# Patient Record
Sex: Male | Born: 1985 | Race: Black or African American | Hispanic: No | Marital: Single | Smoking: Current some day smoker
Health system: Southern US, Community
[De-identification: ages and names within clinical notes are randomized; demographics above are authoritative.]

---

## 2008-08-06 ENCOUNTER — Emergency Department (HOSPITAL_COMMUNITY): Admission: EM | Admit: 2008-08-06 | Discharge: 2008-08-06 | Payer: Self-pay | Admitting: Emergency Medicine

## 2009-11-28 IMAGING — CT CT NECK W/ CM
3 series · 16 of 33 positions shown, 19 images · IV contrast (agent unspecified)
Comparison: None

CLINICAL DATA: Sore throat, difficulty swallowing

CT NECK WITH CONTRAST
TECHNIQUE: Multidetector CT imaging of the neck was performed with
intravenous contrast.   Sagittal and coronal MPR images
reconstructed from axial data set.
Contrast: 100 ml Dmnipaque-XCC

[Series 2: neck rtn st · axial · 0.39mm/px · z∈[-222,-56]mm · 8 of 67 slices shown, 10 images]
[im 6/67  soft-tissue]
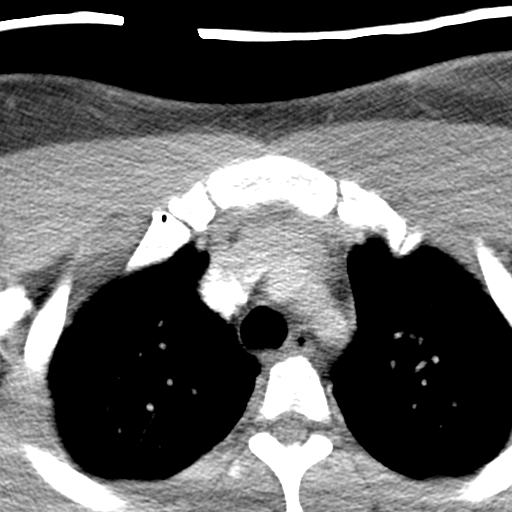
[im 6/67  bone]
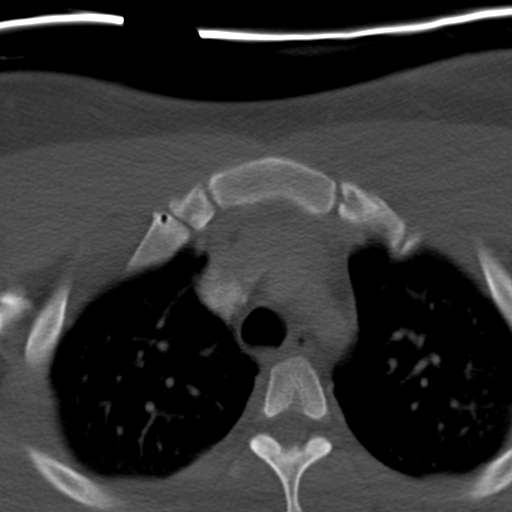
[im 16/67  bone]
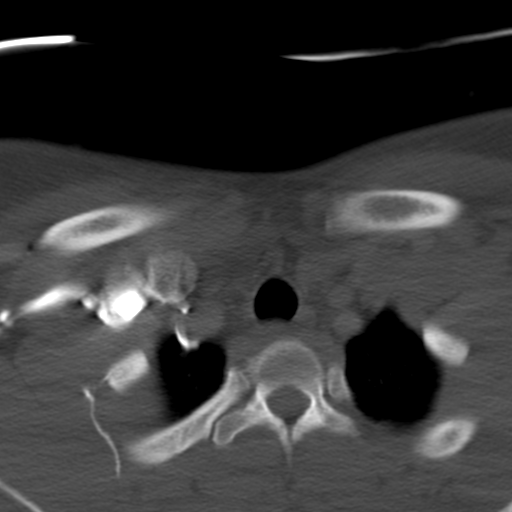
[im 21/67  bone]
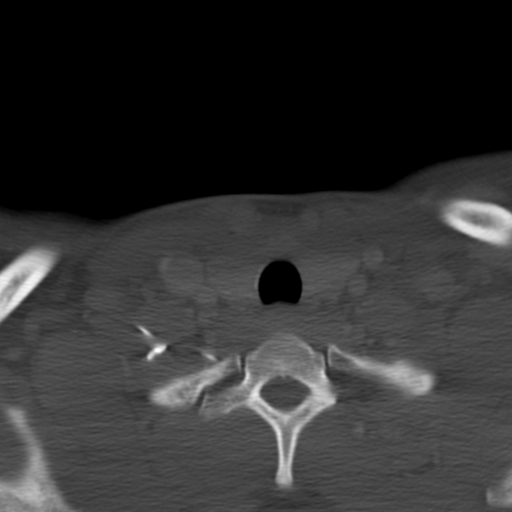
[im 31/67  bone]
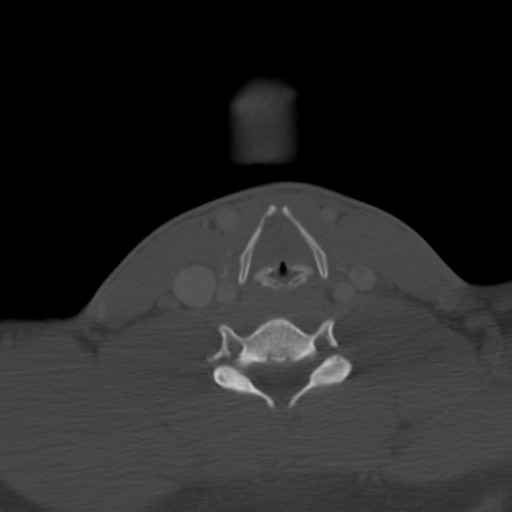
[im 36/67  soft-tissue]
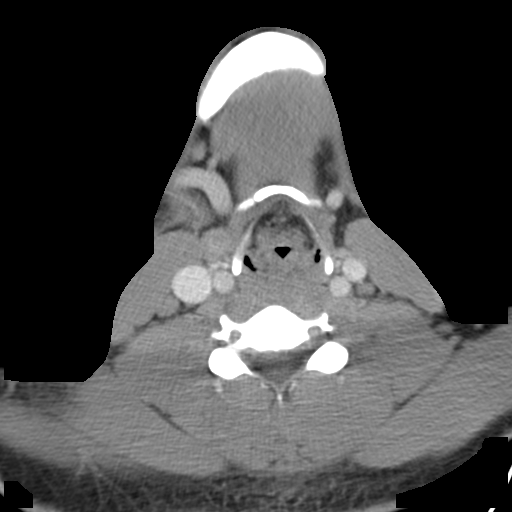
[im 36/67  bone]
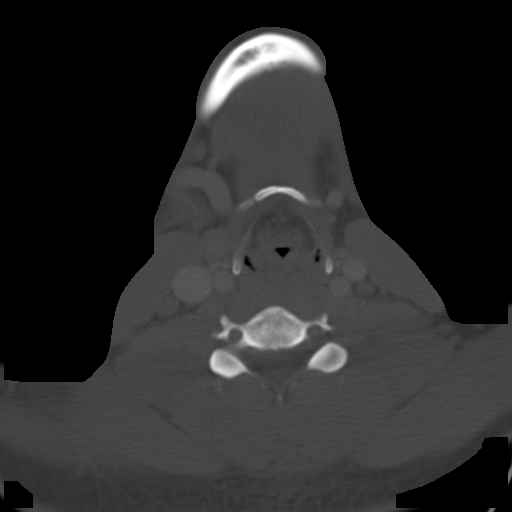
[im 46/67  bone]
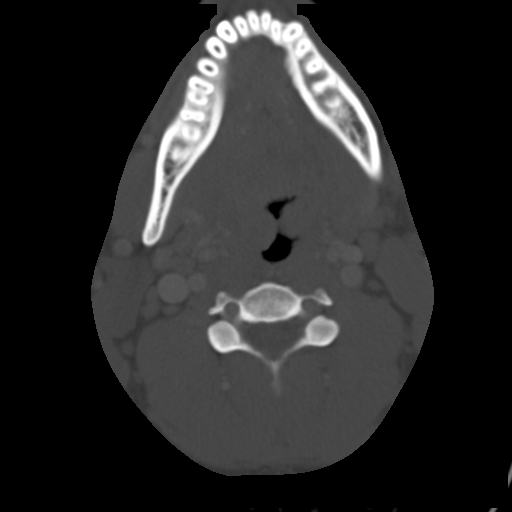
[im 51/67  bone]
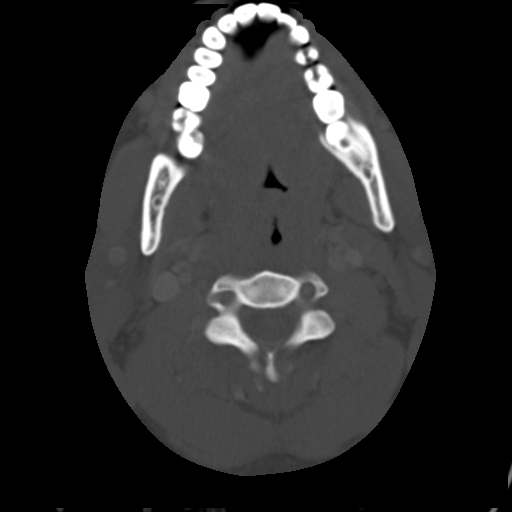
[im 61/67  bone]
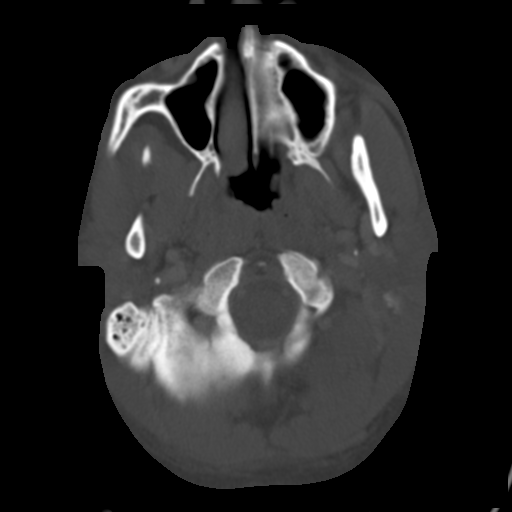

[Series 602: coronal images · coronal · 0.39mm/px · 3 of 99 slices shown]
[im 20/99  bone]
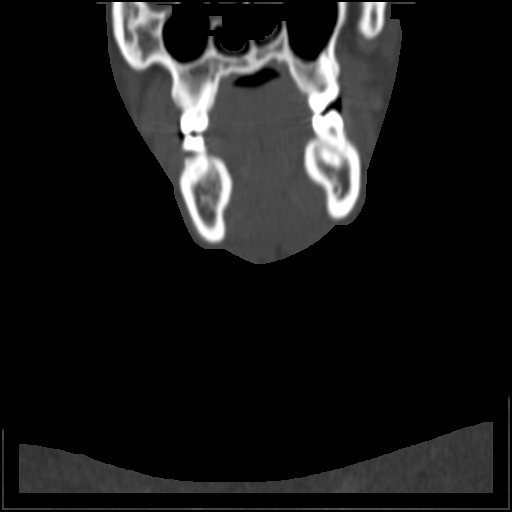
[im 40/99  bone]
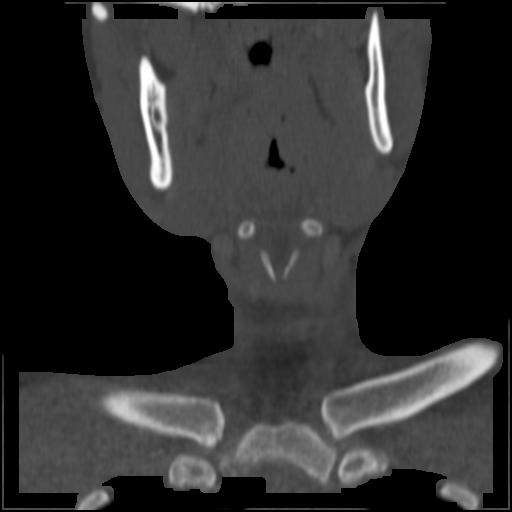
[im 59/99  bone]
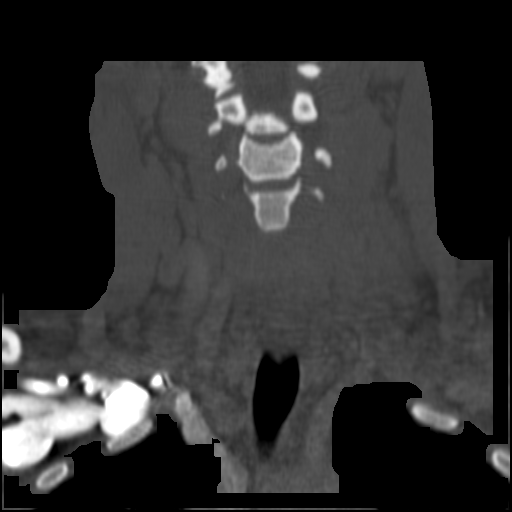

[Series 603: sagittal images · sagittal · 0.39mm/px · 5 of 98 slices shown, 6 images]
[im 33/98  bone]
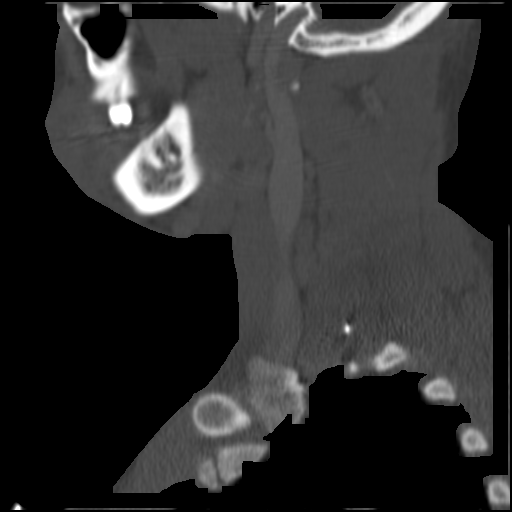
[im 41/98  bone]
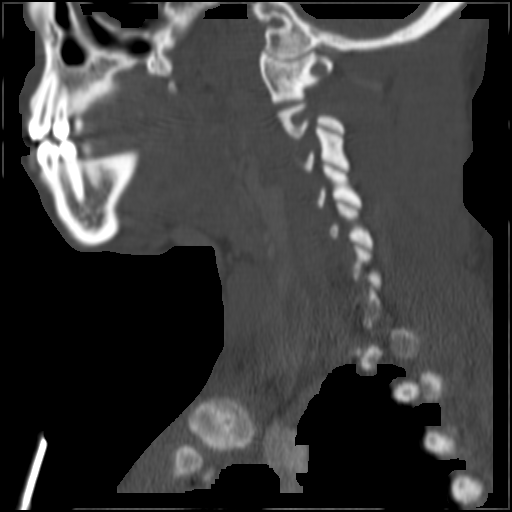
[im 49/98  soft-tissue]
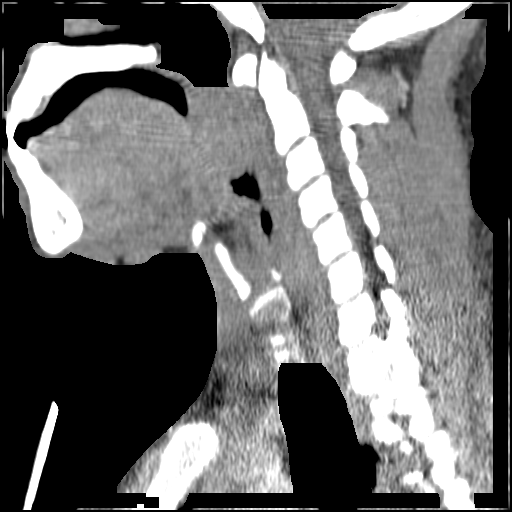
[im 49/98  bone]
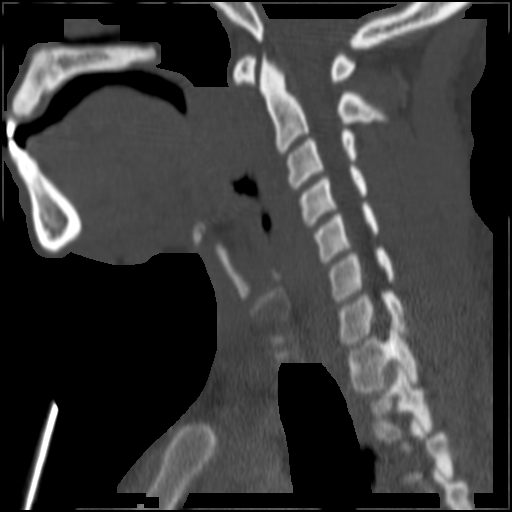
[im 57/98  bone]
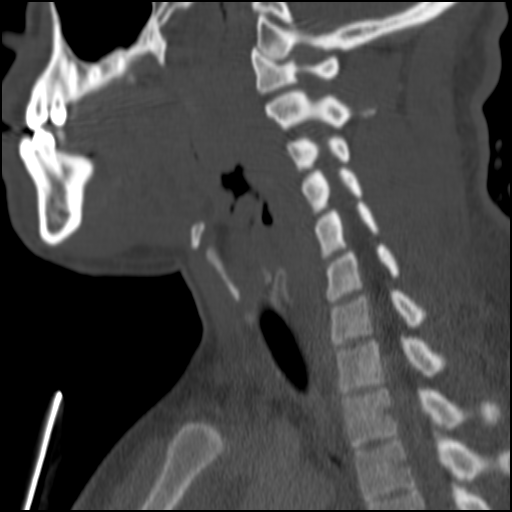
[im 65/98  bone]
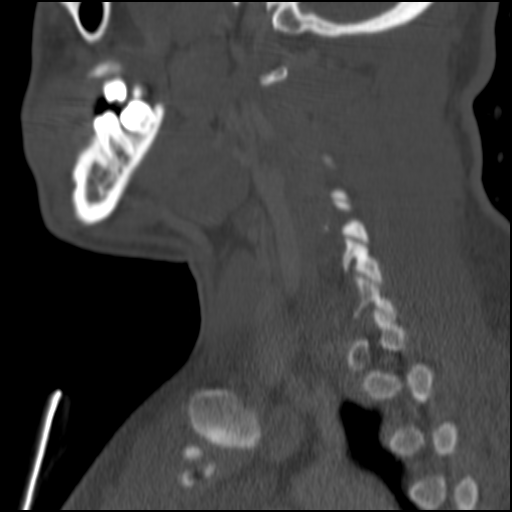

[16 of 33 positions shown; findings below may reference images not displayed]

FINDINGS: Mild enlargement of parapharyngeal lymphoid tissue and tonsils.
In addition, focal area of low attenuation is seen in the right
parapharyngeal soft tissues, 14 x 10 mm in size, compatible with
edema.
This may represent phlegmon or early abscess formation but does not
appear well defined or encapsulated at this point.
Narrowing of oropharynx.
Parapharyngeal soft tissue planes are diffusely infiltrated on the
right.
Prevertebral soft tissues normal in thickness.
Vascular structures patent.
Symmetric parotid, submandibular and thyroid glands.
No abnormal soft tissue gas identified.
Visualized skull base intact.
Lung apices clear.
IMPRESSION: Tonsillar and parapharyngeal lymphoid tissue enlargement diffusely,
with narrowing of the oropharynx and hypopharynx.
Scattered edema in the right parapharyngeal soft tissues with a
more focal area of low attenuation which may represent phlegmon or
developing abscess, 14 x 10 mm in size, though this does not appear
well defined or mature at this point.

## 2010-06-05 LAB — BASIC METABOLIC PANEL
BUN: 10 mg/dL (ref 6–23)
CO2: 24 mEq/L (ref 19–32)
Chloride: 104 mEq/L (ref 96–112)
Potassium: 3.6 mEq/L (ref 3.5–5.1)

## 2010-06-05 LAB — CBC
HCT: 46 % (ref 39.0–52.0)
Hemoglobin: 15.4 g/dL (ref 13.0–17.0)
Platelets: 222 10*3/uL (ref 150–400)
RBC: 5.4 MIL/uL (ref 4.22–5.81)
WBC: 9.4 10*3/uL (ref 4.0–10.5)

## 2010-06-05 LAB — DIFFERENTIAL
Eosinophils Relative: 1 % (ref 0–5)
Lymphocytes Relative: 20 % (ref 12–46)
Lymphs Abs: 1.9 10*3/uL (ref 0.7–4.0)
Monocytes Relative: 9 % (ref 3–12)

## 2010-06-05 LAB — RAPID STREP SCREEN (MED CTR MEBANE ONLY): Streptococcus, Group A Screen (Direct): NEGATIVE

## 2010-06-05 LAB — MONONUCLEOSIS SCREEN: Mono Screen: POSITIVE — AB

## 2014-10-10 ENCOUNTER — Emergency Department (HOSPITAL_COMMUNITY): Payer: Self-pay

## 2014-10-10 ENCOUNTER — Encounter (HOSPITAL_COMMUNITY): Payer: Self-pay

## 2014-10-10 DIAGNOSIS — S20211A Contusion of right front wall of thorax, initial encounter: Secondary | ICD-10-CM | POA: Insufficient documentation

## 2014-10-10 DIAGNOSIS — Y9361 Activity, american tackle football: Secondary | ICD-10-CM | POA: Insufficient documentation

## 2014-10-10 DIAGNOSIS — Y998 Other external cause status: Secondary | ICD-10-CM | POA: Insufficient documentation

## 2014-10-10 DIAGNOSIS — Y9289 Other specified places as the place of occurrence of the external cause: Secondary | ICD-10-CM | POA: Insufficient documentation

## 2014-10-10 DIAGNOSIS — X58XXXA Exposure to other specified factors, initial encounter: Secondary | ICD-10-CM | POA: Insufficient documentation

## 2014-10-10 DIAGNOSIS — Z720 Tobacco use: Secondary | ICD-10-CM | POA: Insufficient documentation

## 2014-10-10 MED ORDER — OXYCODONE-ACETAMINOPHEN 5-325 MG PO TABS
ORAL_TABLET | ORAL | Status: AC
  Filled 2014-10-10: qty 1

## 2014-10-10 MED ORDER — OXYCODONE-ACETAMINOPHEN 5-325 MG PO TABS
1.0000 | ORAL_TABLET | Freq: Once | ORAL | Status: AC
  Administered 2014-10-10: 1 via ORAL

## 2014-10-10 NOTE — ED Notes (Signed)
Per PTAR, pt was playing football earlier today and landed on the football on right rib area. Went home and wasn't bothering him. Tonight woke up at 2100 and was hurting to breathe. Didn't feel a pop but is guarding the area.

## 2014-10-11 ENCOUNTER — Emergency Department (HOSPITAL_COMMUNITY)
Admission: EM | Admit: 2014-10-11 | Discharge: 2014-10-11 | Disposition: A | Discharge status: Home / Self Care | Payer: Self-pay | Attending: Emergency Medicine | Admitting: Emergency Medicine

## 2014-10-11 DIAGNOSIS — S20211A Contusion of right front wall of thorax, initial encounter: Secondary | ICD-10-CM

## 2014-10-11 MED ORDER — KETOROLAC TROMETHAMINE 60 MG/2ML IM SOLN
60.0000 mg | Freq: Once | INTRAMUSCULAR | Status: AC
  Administered 2014-10-11: 60 mg via INTRAMUSCULAR
  Filled 2014-10-11: qty 2

## 2014-10-11 MED ORDER — HYDROCODONE-ACETAMINOPHEN 5-325 MG PO TABS: 2.0000 | ORAL_TABLET | ORAL | Status: DC | PRN

## 2014-10-11 MED ORDER — DICLOFENAC SODIUM 75 MG PO TBEC: 75.0000 mg | DELAYED_RELEASE_TABLET | Freq: Two times a day (BID) | ORAL | Status: DC

## 2014-10-11 NOTE — ED Provider Notes (Signed)
CSN: [id number]     Arrival date & time 10/10/14  2318 History   First MD Initiated Contact with Patient 10/11/14 0020     Chief Complaint  Patient presents with  . rib pain      (Consider location/radiation/quality/duration/timing/severity/associated sxs/prior Treatment) Patient is a 29 y.o. male presenting with chest pain. The history is provided by the patient. No language interpreter was used.  Chest Pain Pain location:  R chest Pain quality: aching   Pain radiates to:  Does not radiate Pain radiates to the back: no   Pain severity:  Moderate Onset quality:  Sudden Duration:  6 hours Timing:  Constant Progression:  Worsening Chronicity:  New Context: breathing and movement   Relieved by:  Nothing Associated symptoms: no abdominal pain   Pt reports he fell onto the football while playing football.  Pt complains of pain in ribs  History reviewed. No pertinent past medical history. History reviewed. No pertinent past surgical history. No family history on file. Social History  Substance Use Topics  . Smoking status: Current Every Day Smoker  . Smokeless tobacco: None  . Alcohol Use: Yes     Comment: socially    Review of Systems  Cardiovascular: Positive for chest pain.  Gastrointestinal: Negative for abdominal pain.  All other systems reviewed and are negative.     Allergies  Review of patient's allergies indicates no known allergies.  Home Medications   Prior to Admission medications   Not on File   BP 109/69 mmHg  Pulse 72  Temp(Src) 98.4 F (36.9 C) (Oral)  Resp 20  SpO2 97% Physical Exam  Constitutional: He is oriented to person, place, and time. He appears well-developed and well-nourished.  HENT:  Head: Normocephalic.  Eyes: Conjunctivae and EOM are normal. Pupils are equal, round, and reactive to light.  Neck: Normal range of motion.  Cardiovascular: Normal rate.   Pulmonary/Chest: Effort normal.  Tender right lower anterior ribs.     Abdominal: He exhibits no distension.  Musculoskeletal: Normal range of motion.  Neurological: He is alert and oriented to person, place, and time.  Skin: Skin is warm.  Psychiatric: He has a normal mood and affect.  Nursing note and vitals reviewed.   ED Course  Procedures (including critical care time) Labs Review Labs Reviewed - No data to display  Imaging Review Dg Chest 2 View  10/11/2014   CLINICAL DATA:  Acute onset of right lower anterior chest pain and shortness of breath. Initial encounter.  EXAM: CHEST  2 VIEW  COMPARISON:  None.  FINDINGS: The lungs are well-aerated and clear. There is no evidence of focal opacification, pleural effusion or pneumothorax.  The heart is normal in size; the mediastinal contour is within normal limits. No acute osseous abnormalities are seen.  IMPRESSION: No acute cardiopulmonary process seen. No displaced rib fractures identified.   Electronically Signed   By: Roanna Raider M.D.   On: 10/11/2014 00:50   I, SOFIA,KAREN, personally reviewed and evaluated these images and lab results as part of my medical decision-making.   EKG Interpretation None      MDM  Pt reports no relief with percocet.  Pt given torodol Chest xray normal.   Final diagnoses:  Contusion of right chest wall, initial encounter    Hydrocodone voltaren Return if any problems.     Elson Areas, PA-C 10/11/14 0124  Cy Blamer, MD 10/11/14 [phone]

## 2014-10-11 NOTE — Discharge Instructions (Signed)

## 2014-10-11 NOTE — ED Notes (Signed)
Provider at bedside

## 2016-02-01 IMAGING — CR DG CHEST 2V
2 series · 2 of 2 positions shown · non-contrast
Comparison: None.

CLINICAL DATA: Acute onset of right lower anterior chest pain and
shortness of breath. Initial encounter.

EXAM:
CHEST  2 VIEW

[chest pa]
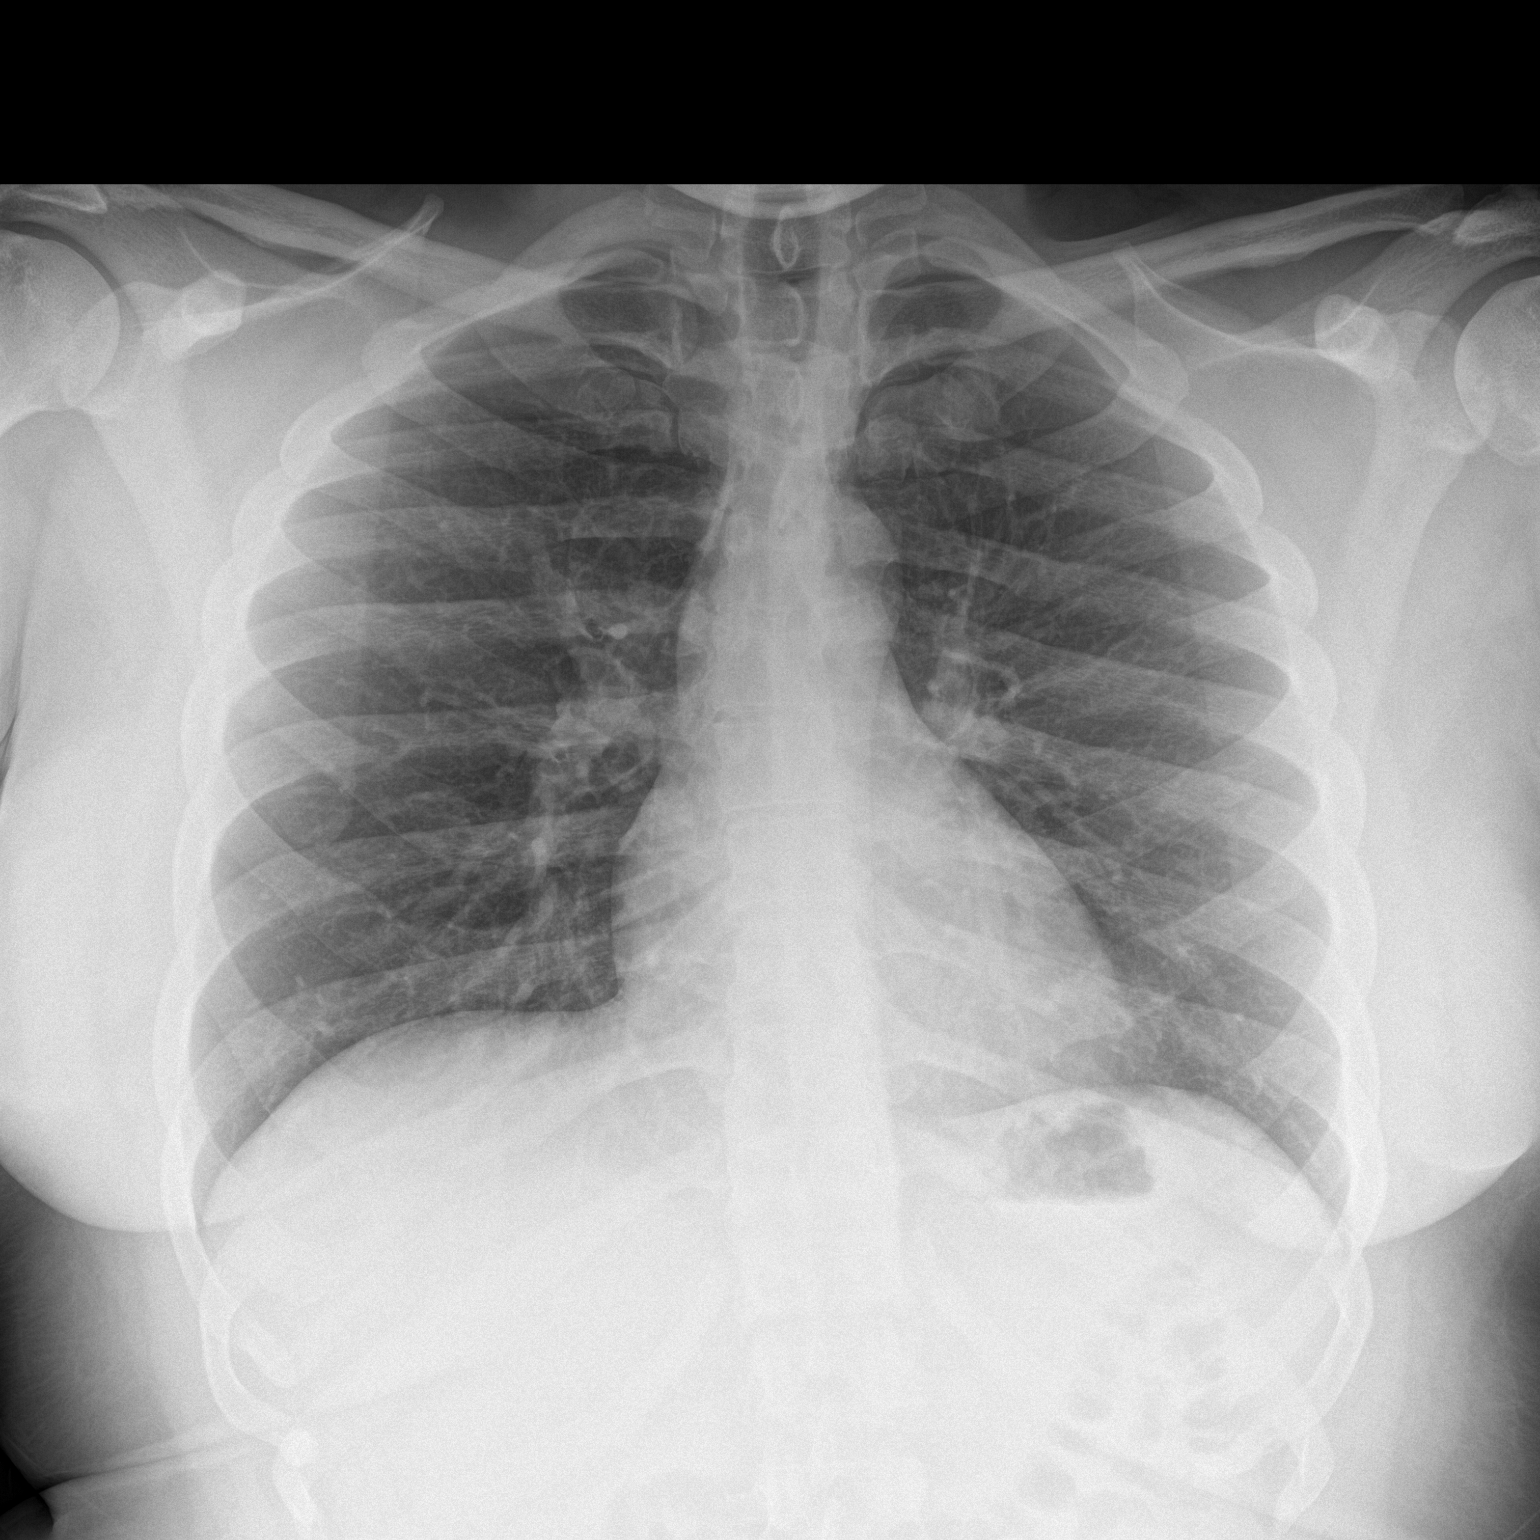

[chest lat]
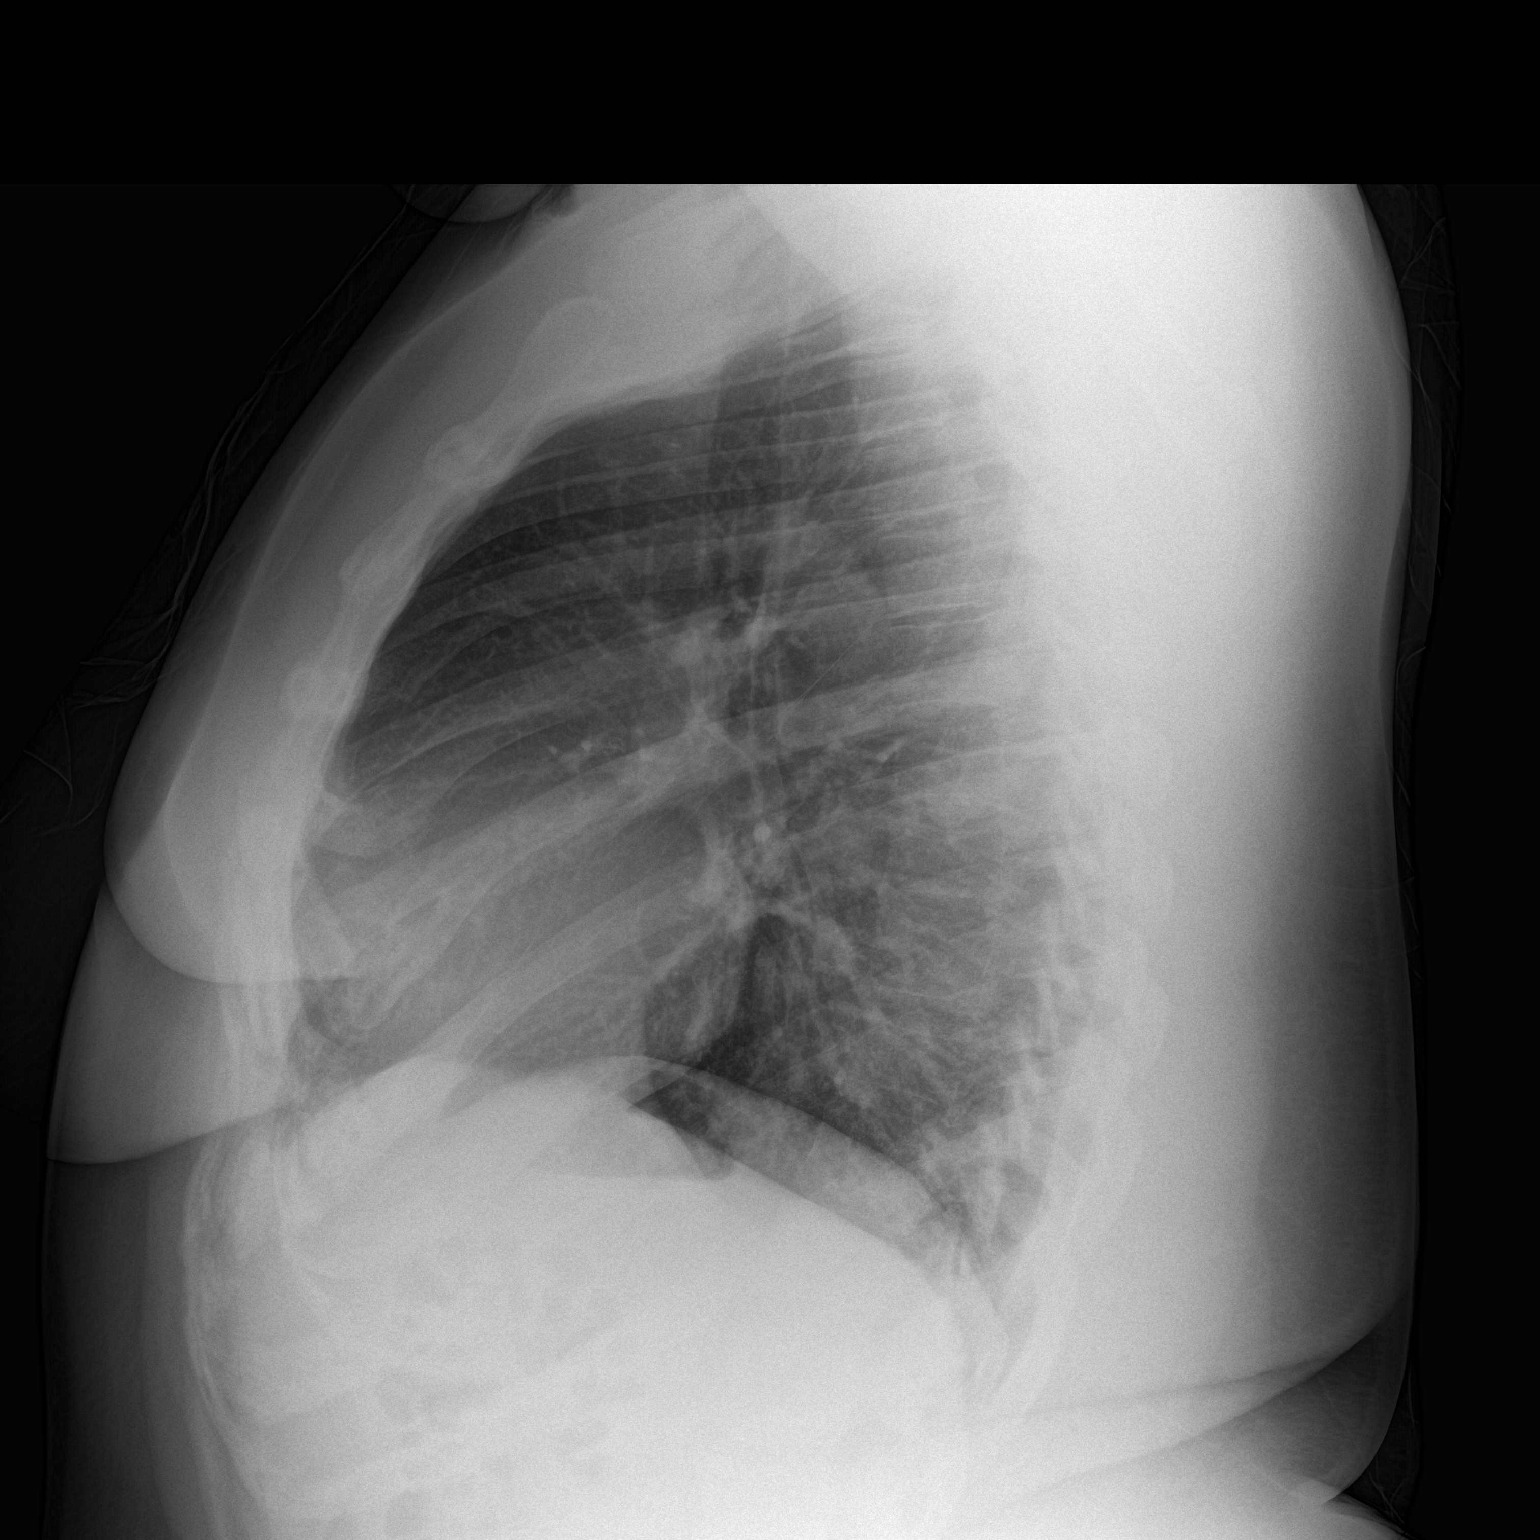

[2 of 2 positions shown; findings below may reference images not displayed]

FINDINGS: The lungs are well-aerated and clear. There is no evidence of focal
opacification, pleural effusion or pneumothorax.

The heart is normal in size; the mediastinal contour is within
normal limits. No acute osseous abnormalities are seen.
IMPRESSION: No acute cardiopulmonary process seen. No displaced rib fractures
identified.

## 2018-05-26 ENCOUNTER — Ambulatory Visit (HOSPITAL_COMMUNITY)
Admission: EM | Admit: 2018-05-26 | Discharge: 2018-05-26 | Disposition: A | Discharge status: Home / Self Care | Payer: Medicaid Other | Attending: Family Medicine | Admitting: Family Medicine

## 2018-05-26 ENCOUNTER — Other Ambulatory Visit: Payer: Self-pay

## 2018-05-26 ENCOUNTER — Encounter (HOSPITAL_COMMUNITY): Payer: Self-pay

## 2018-05-26 DIAGNOSIS — M25511 Pain in right shoulder: Secondary | ICD-10-CM

## 2018-05-26 MED ORDER — PREDNISONE 10 MG (21) PO TBPK: ORAL_TABLET | Freq: Every day | ORAL | 0 refills | Status: DC

## 2018-05-26 MED ORDER — DICLOFENAC SODIUM 75 MG PO TBEC: 75.0000 mg | DELAYED_RELEASE_TABLET | Freq: Two times a day (BID) | ORAL | 0 refills | Status: DC

## 2018-05-26 NOTE — ED Triage Notes (Signed)
Pt cc he was working and he went to reach for a package. Pt felt a burning and sting sensation in his right shoulder. X 3 days. Pt states the pain is constant. Pt tried a muscle relaxer.

## 2018-05-28 NOTE — ED Provider Notes (Signed)
[facility]   [id number] 05/26/18 Arrival Time: 0920  ASSESSMENT & PLAN:  1. Acute pain of right shoulder    No indication for shoulder imaging. Discussed. No signs of a frozen shoulder.  Meds ordered this encounter  Medications  . diclofenac (VOLTAREN) 75 MG EC tablet    Sig: Take 1 tablet (75 mg total) by mouth 2 (two) times daily.    Dispense:  14 tablet    Refill:  0  . predniSONE (STERAPRED UNI-PAK 21 TAB) 10 MG (21) TBPK tablet    Sig: Take by mouth daily. Take as directed.    Dispense:  21 tablet    Refill:  0   Follow-up Information    [facility]   Specialty:  Specialist Why:  Call to schedule a follow up if not improving over the next week. Contact information: [address] [place] [address] [phone]          Rest the injured area as much as practical but encouraged ROM; demonstrated.  Reviewed expectations re: course of current medical issues. Questions answered. Outlined signs and symptoms indicating need for more acute intervention. Patient verbalized understanding. After Visit Summary given.  SUBJECTIVE: History from: patient. Kevin Horne is a 33 y.o. male who reports fairly persistent moderate pain of his right shoulder; described as aching and burning without radiation. Onset: abrupt, 3 days ago. Injury/trama: reports feeling a burning and sharp pain while lifting a package at work. Symptoms have progressed to a point and plateaued since beginning. Is limiting his daily work. Aggravating factors: certain movements. Alleviating factors: rest. Associated symptoms: none reported. Extremity sensation changes or weakness: none. Self treatment: has tried taking a muscle relaxer he had at home; some help History of similar: no.  History reviewed. No pertinent surgical history.   ROS: As per HPI. All other systems negative.    OBJECTIVE:  Vitals:   05/26/18 0944  BP: 130/90  Pulse: 66  Resp: 18  Temp:  97.6 F (36.4 C)  TempSrc: Oral  SpO2: 100%  Weight: (!) 140.6 kg    General appearance: alert; no distress HEENT: [place]; AT Neck: supple with FROM; no midline tenderness Lungs: unlabored respirations Extremities: . RUE: warm and well perfused; poorly localized moderate tenderness over right shoulder; no specific bony tenderness; without gross deformities; with no swelling; with no bruising; ROM: limited by pain; normal grip CV: brisk extremity capillary refill of RUE; 2+ radial pulse of RUE. Skin: warm and dry; no visible rashes Neurologic: gait normal; normal reflexes of RUE and LUE; normal sensation of RUE and LUE; normal strength of RUE and LUE Psychological: alert and cooperative; normal mood and affect  No Known Allergies  PMH: No h/o shoulder dislocation/pain/trauma.  Social History   Socioeconomic History  . Marital status: Single    Spouse name: Not on file  . Number of children: Not on file  . Years of education: Not on file  . Highest education level: Not on file  Occupational History  . Not on file  Social Needs  . Financial resource strain: Not on file  . Food insecurity:    Worry: Not on file    Inability: Not on file  . Transportation needs:    Medical: Not on file    Non-medical: Not on file  Tobacco Use  . Smoking status: Current Every Day Smoker  . Smokeless tobacco: Never Used  Substance and Sexual Activity  . Alcohol use: Yes    Comment: socially  .  Drug use: No  . Sexual activity: Not on file  Lifestyle  . Physical activity:    Days per week: Not on file    Minutes per session: Not on file  . Stress: Not on file  Relationships  . Social connections:    Talks on phone: Not on file    Gets together: Not on file    Attends religious service: Not on file    Active member of club or organization: Not on file    Attends meetings of clubs or organizations: Not on file    Relationship status: Not on file  Other Topics Concern  . Not on file   Social History Narrative  . Not on file   Family History  Problem Relation Age of Onset  . Diabetes Father   . Hypertension Father    History reviewed. No pertinent surgical history.    Mardella Layman, MD 06/03/18 0930

## 2018-10-28 ENCOUNTER — Emergency Department (HOSPITAL_COMMUNITY)
Admission: EM | Admit: 2018-10-28 | Discharge: 2018-10-28 | Disposition: A | Discharge status: Home / Self Care | Payer: Medicaid Other | Attending: Emergency Medicine | Admitting: Emergency Medicine

## 2018-10-28 ENCOUNTER — Other Ambulatory Visit: Payer: Self-pay

## 2018-10-28 ENCOUNTER — Encounter (HOSPITAL_COMMUNITY): Payer: Self-pay | Admitting: Emergency Medicine

## 2018-10-28 DIAGNOSIS — Y939 Activity, unspecified: Secondary | ICD-10-CM | POA: Diagnosis not present

## 2018-10-28 DIAGNOSIS — S0990XA Unspecified injury of head, initial encounter: Secondary | ICD-10-CM | POA: Insufficient documentation

## 2018-10-28 DIAGNOSIS — W208XXA Other cause of strike by thrown, projected or falling object, initial encounter: Secondary | ICD-10-CM | POA: Insufficient documentation

## 2018-10-28 DIAGNOSIS — F17210 Nicotine dependence, cigarettes, uncomplicated: Secondary | ICD-10-CM | POA: Insufficient documentation

## 2018-10-28 DIAGNOSIS — Y990 Civilian activity done for income or pay: Secondary | ICD-10-CM | POA: Diagnosis not present

## 2018-10-28 DIAGNOSIS — Y929 Unspecified place or not applicable: Secondary | ICD-10-CM | POA: Insufficient documentation

## 2018-10-28 MED ORDER — ACETAMINOPHEN 325 MG PO TABS
650.0000 mg | ORAL_TABLET | Freq: Once | ORAL | Status: AC
  Administered 2018-10-28: 650 mg via ORAL
  Filled 2018-10-28: qty 2

## 2018-10-28 NOTE — Discharge Instructions (Signed)
Take Tylenol as needed as directed for headache. Follow up with your workers comp provider.

## 2018-10-28 NOTE — ED Triage Notes (Signed)
Pt reports last night at work ([employer]) when loading a box another box hit him in the head and he had LOC. Reports that co-workers wouldn't let him leave. C/o left side head pains. Box was aprrox 42 lbs

## 2018-10-28 NOTE — ED Provider Notes (Signed)
[facility] DEPT Provider Note   CSN: [id number] Arrival date & time: 10/28/18  1454     History   Chief Complaint Chief Complaint  Patient presents with  . Head Injury    HPI Kevin Horne is a 33 y.o. male.     33yo male with no significant past medical history presents for evaluation after head injury.  Patient states he was at work at 10:00 last night when he was hit on the left side of his head by a 42 pound box.  Patient states he was knocked to the ground when this happened, no loss of consciousness.  Patient became angry with his supervisor when he asked to go to the emergency room for evaluation and was started told to apply an ice pack to the area and take a break outside.  Is frustrated that he was forced to continue to work last night.  Patient reports pain in the left side of his head, has not taken anything for his pain.  Denies visual disturbance, nausea, vomiting, changes in gait or speech.  No other injuries or concerns.     History reviewed. No pertinent past medical history.  There are no active problems to display for this patient.   History reviewed. No pertinent surgical history.      Home Medications    Prior to Admission medications   Medication Sig Start Date End Date Taking? Authorizing Provider  diclofenac (VOLTAREN) 75 MG EC tablet Take 1 tablet (75 mg total) by mouth 2 (two) times daily. 05/26/18   Vanessa Kick, MD  predniSONE (STERAPRED UNI-PAK 21 TAB) 10 MG (21) TBPK tablet Take by mouth daily. Take as directed. 05/26/18   Vanessa Kick, MD    Family History Family History  Problem Relation Age of Onset  . Diabetes Father   . Hypertension Father     Social History Social History   Tobacco Use  . Smoking status: Current Every Day Smoker  . Smokeless tobacco: Never Used  Substance Use Topics  . Alcohol use: Yes    Comment: socially  . Drug use: No     Allergies   Patient has no known allergies.    Review of Systems Review of Systems  Constitutional: Negative for fever.  Eyes: Negative for photophobia and visual disturbance.  Gastrointestinal: Negative for nausea and vomiting.  Musculoskeletal: Negative for arthralgias, back pain, gait problem, myalgias, neck pain and neck stiffness.  Skin: Negative for rash and wound.  Allergic/Immunologic: Negative for immunocompromised state.  Neurological: Positive for headaches. Negative for dizziness, weakness and numbness.  Hematological: Does not bruise/bleed easily.  Psychiatric/Behavioral: Negative for confusion.  All other systems reviewed and are negative.    Physical Exam Updated Vital Signs BP 117/73   Pulse 67   Temp 98.4 F (36.9 C) (Oral)   Resp 18   SpO2 98%   Physical Exam Vitals signs and nursing note reviewed.  Constitutional:      General: He is not in acute distress.    Appearance: He is well-developed. He is not diaphoretic.  HENT:     Head: Normocephalic and atraumatic.      Right Ear: Tympanic membrane and ear canal normal.     Left Ear: Tympanic membrane and ear canal normal.     Mouth/Throat:     Mouth: Mucous membranes are moist.     Pharynx: No oropharyngeal exudate or posterior oropharyngeal erythema.  Eyes:     General: No visual field deficit.  Extraocular Movements: Extraocular movements intact.     Conjunctiva/sclera: Conjunctivae normal.     Pupils: Pupils are equal, round, and reactive to light.  Neck:     Musculoskeletal: Normal range of motion and neck supple. Normal range of motion. Muscular tenderness present. No pain with movement or spinous process tenderness.   Cardiovascular:     Rate and Rhythm: Normal rate and regular rhythm.     Pulses: Normal pulses.     Heart sounds: Normal heart sounds.  Pulmonary:     Effort: Pulmonary effort is normal.     Breath sounds: Normal breath sounds.  Musculoskeletal: Normal range of motion.        General: Tenderness present. No swelling or  deformity.  Skin:    General: Skin is warm and dry.     Findings: No erythema or rash.  Neurological:     Mental Status: He is alert and oriented to person, place, and time.     GCS: GCS eye subscore is 4. GCS verbal subscore is 5. GCS motor subscore is 6.     Cranial Nerves: Cranial nerves are intact. No cranial nerve deficit, dysarthria or facial asymmetry.     Sensory: Sensation is intact.     Motor: Motor function is intact.     Gait: Gait is intact. Gait normal.  Psychiatric:        Behavior: Behavior normal.      ED Treatments / Results  Labs (all labs ordered are listed, but only abnormal results are displayed) Labs Reviewed - No data to display  EKG None  Radiology No results found.  Procedures Procedures (including critical care time)  Medications Ordered in ED Medications  acetaminophen (TYLENOL) tablet 650 mg (650 mg Oral Given 10/28/18 1705)     Initial Impression / Assessment and Plan / ED Course  I have reviewed the triage vital signs and the nursing notes.  Pertinent labs & imaging results that were available during my care of the patient were reviewed by me and considered in my medical decision making (see chart for details).  Clinical Course as of Oct 27 1704  Tue Oct 28, 2018  [id number] 33 year old male presents for evaluation after being hit in the head with a 42 pound box at work last night.  Patient reports pain to left side of his head.  Patient is not on blood thinners.  Patient is tenderness in the left parietal area, no swelling or ecchymosis noted.  Mild tenderness left trapezius area, no midline or bony tenderness of the neck.  Neuro exam is unremarkable, patient with this with unsteady gait.  Patient given Tylenol for his headache, recommend that he recheck with his occupational health Worker's Comp. Provider. No evidence of significant head injury requiring CT imaging at this time.    [LM]    Clinical Course User Index [LM] Jeannie FendMurphy, Laura A, PA-C       Final Clinical Impressions(s) / ED Diagnoses   Final diagnoses:  Injury of head, initial encounter    ED Discharge Orders    None       Jeannie FendMurphy, Laura A, PA-C 10/28/18 1706    Sabas SousBero, Michael M, MD 10/30/18 [phone]

## 2018-10-28 NOTE — ED Notes (Signed)
Pt wanted to talke with Mickel Baas PA again about follow up, informed Mickel Baas of pt request.

## 2019-08-12 ENCOUNTER — Emergency Department (HOSPITAL_COMMUNITY)
Admission: EM | Admit: 2019-08-12 | Discharge: 2019-08-13 | Disposition: A | Discharge status: Home / Self Care | Payer: BC Managed Care – PPO | Attending: Emergency Medicine | Admitting: Emergency Medicine

## 2019-08-12 ENCOUNTER — Other Ambulatory Visit: Payer: Self-pay

## 2019-08-12 ENCOUNTER — Encounter (HOSPITAL_COMMUNITY): Payer: Self-pay | Admitting: *Deleted

## 2019-08-12 DIAGNOSIS — Y999 Unspecified external cause status: Secondary | ICD-10-CM | POA: Diagnosis not present

## 2019-08-12 DIAGNOSIS — Y9289 Other specified places as the place of occurrence of the external cause: Secondary | ICD-10-CM | POA: Diagnosis not present

## 2019-08-12 DIAGNOSIS — Y9389 Activity, other specified: Secondary | ICD-10-CM | POA: Diagnosis not present

## 2019-08-12 DIAGNOSIS — S0990XA Unspecified injury of head, initial encounter: Secondary | ICD-10-CM

## 2019-08-12 DIAGNOSIS — Z79899 Other long term (current) drug therapy: Secondary | ICD-10-CM | POA: Diagnosis not present

## 2019-08-12 DIAGNOSIS — S0001XA Abrasion of scalp, initial encounter: Secondary | ICD-10-CM | POA: Insufficient documentation

## 2019-08-12 DIAGNOSIS — Z23 Encounter for immunization: Secondary | ICD-10-CM | POA: Diagnosis not present

## 2019-08-12 DIAGNOSIS — F17210 Nicotine dependence, cigarettes, uncomplicated: Secondary | ICD-10-CM | POA: Diagnosis not present

## 2019-08-12 DIAGNOSIS — W2209XA Striking against other stationary object, initial encounter: Secondary | ICD-10-CM | POA: Insufficient documentation

## 2019-08-12 NOTE — ED Triage Notes (Signed)
The pt struck his head against a metal  Piece at work last pm  Small abrasion to his lt scalp

## 2019-08-13 DIAGNOSIS — S0001XA Abrasion of scalp, initial encounter: Secondary | ICD-10-CM | POA: Diagnosis not present

## 2019-08-13 MED ORDER — TETANUS-DIPHTH-ACELL PERTUSSIS 5-2.5-18.5 LF-MCG/0.5 IM SUSP
0.5000 mL | Freq: Once | INTRAMUSCULAR | Status: AC
  Administered 2019-08-13: 0.5 mL via INTRAMUSCULAR
  Filled 2019-08-13: qty 0.5

## 2019-08-13 NOTE — ED Notes (Signed)
Pt called x 3  No answer. 

## 2019-08-13 NOTE — Discharge Instructions (Signed)
Warm soapy water to head. Do not scrub the area. Neosporin over the counter to abrasion.  Get help right away if: You have: A very bad headache that is not helped by medicine. Trouble walking or weakness in your arms and legs. Clear or bloody fluid coming from your nose or ears. Changes in how you see (vision). Shaking movements that you cannot control. You lose your balance. You vomit. The black centers of your eyes (pupils) change in size. Your speech is slurred. Your dizziness gets worse. You pass out. You are sleepier than normal and have trouble staying awake. Your symptoms get worse.

## 2019-08-13 NOTE — ED Provider Notes (Signed)
[facility] EMERGENCY DEPARTMENT Provider Note   CSN: [id number] Arrival date & time: 08/12/19  2247    History Chief Complaint  Patient presents with  . Laceration    Kevin Horne is a 34 y.o. male with no significant past medical history who presents for evaluation of possible laceration.  Patient states he was at work when he went to stand up he had a metal pole on the left frontal aspect of his scalp.  Suffered a skin tear.  Unknown last tetanus.  No LOC anticoagulation.  He denies any headache, lightness, dizziness, blurred vision, chest pain, shortness of breath, neck pain, neck stiffness, weakness, facial droop.  Denies of aggravating or alleviating factors.  Patient states he only has pain when he palpates his wound.  He currently has pain 0 out of 10.  Denies additional rating alleviating factors. Skin tear was initially bleeding however bleeding stopped prior to arrival.  No drainage currently.  History obtained from patient and past medical records.  No interpreter is used.  HPI     History reviewed. No pertinent past medical history.  There are no problems to display for this patient.   History reviewed. No pertinent surgical history.     Family History  Problem Relation Age of Onset  . Diabetes Father   . Hypertension Father     Social History   Tobacco Use  . Smoking status: Current Every Day Smoker  . Smokeless tobacco: Never Used  Substance Use Topics  . Alcohol use: Yes    Comment: socially  . Drug use: No    Home Medications Prior to Admission medications   Medication Sig Start Date End Date Taking? Authorizing Provider  diclofenac (VOLTAREN) 75 MG EC tablet Take 1 tablet (75 mg total) by mouth 2 (two) times daily. 05/26/18   Mardella Layman, MD  predniSONE (STERAPRED UNI-PAK 21 TAB) 10 MG (21) TBPK tablet Take by mouth daily. Take as directed. 05/26/18   Mardella Layman, MD    Allergies    Patient has no known allergies.  Review  of Systems   Review of Systems  Constitutional: Negative.   HENT: Negative.   Respiratory: Negative.   Cardiovascular: Negative.   Gastrointestinal: Negative.   Genitourinary: Negative.   Musculoskeletal: Negative.   Skin: Positive for wound.  Neurological: Negative.   All other systems reviewed and are negative.  Physical Exam Updated Vital Signs BP 125/79 (BP Location: Right Wrist)   Pulse 60   Temp 98.4 F (36.9 C) (Oral)   Resp 16   Ht 5\' 7"  (1.702 m)   Wt 120.2 kg   SpO2 100%   BMI 41.50 kg/m   Physical Exam Physical Exam  Constitutional: Pt is oriented to person, place, and time. Pt appears well-developed and well-nourished. No distress.  HENT:  Head: Normocephalic.  No battle sign, raccoon eyes Mouth/Throat: Oropharynx is clear and moist.  Eyes: Conjunctivae and EOM are normal. Pupils are equal, round, and reactive to light. No scleral icterus.  No horizontal, vertical or rotational nystagmus  Ears: No hemotympanum Neck: Normal range of motion. Neck supple.  Full active and passive ROM without pain No midline or paraspinal tenderness No nuchal rigidity or meningeal signs  Cardiovascular: Normal rate, regular rhythm and intact distal pulses.   Pulmonary/Chest: Effort normal and breath sounds normal. No respiratory distress. Pt has no wheezes. No rales.  Abdominal: Soft. Bowel sounds are normal. There is no tenderness. There is no rebound and no guarding.  Musculoskeletal: Normal range of motion.  Lymphadenopathy:    No cervical adenopathy.  Neurological: Pt. is alert and oriented to person, place, and time. He has normal reflexes. No cranial nerve deficit.  Exhibits normal muscle tone. Coordination normal.  Mental Status:  Alert, oriented, thought content appropriate. Speech fluent without evidence of aphasia. Able to follow 2 step commands without difficulty.  Cranial Nerves:  II:  Peripheral visual fields grossly normal, pupils equal, round, reactive to  light III,IV, VI: ptosis not present, extra-ocular motions intact bilaterally  V,VII: smile symmetric, facial light touch sensation equal VIII: hearing grossly normal bilaterally  IX,X: midline uvula rise  XI: bilateral shoulder shrug equal and strong XII: midline tongue extension  Motor:  5/5 in upper and lower extremities bilaterally including strong and equal grip strength and dorsiflexion/plantar flexion Sensory: Pinprick and light touch normal in all extremities.  Deep Tendon Reflexes: 2+ and symmetric  Cerebellar: normal finger-to-nose with bilateral upper extremities Gait: normal gait and balance CV: distal pulses palpable throughout   Skin: Skin is warm and dry. 87mm skin tear to central forehead and hairline.  No bleeding or drainage.  No lacerations. Psychiatric: Pt has a normal mood and affect. Behavior is normal. Judgment and thought content normal.  Nursing note and vitals reviewed. ED Results / Procedures / Treatments   Labs (all labs ordered are listed, but only abnormal results are displayed) Labs Reviewed - No data to display  EKG None  Radiology No results found.  Procedures Procedures (including critical care time)  Medications Ordered in ED Medications  Tdap (BOOSTRIX) injection 0.5 mL (0.5 mLs Intramuscular Given 08/13/19 0038)    ED Course  I have reviewed the triage vital signs and the nursing notes.  Pertinent labs & imaging results that were available during my care of the patient were reviewed by me and considered in my medical decision making (see chart for details).  34 year old presents for evaluation of laceration to head.  Was at work when he went to stand up and his head hit a metal pole.  Has a nonfocal neuro exam without deficits.  No LOC, anticoagulation.  No midline spinal tenderness.  Tenderness over 4 mm skin tear.  No active bleeding or drainage.  No lacerations to suture.  Last tetanus greater than 72 years old, will update.  No facial  bone tenderness, crepitus or step-offs.  Do not feel patient needs imaging at this time as appears low impact.  He currently only has pain when he palpates his wound however no headache.  Reassuring exam.  Wound cleaned.  Discussed return precautions.  Patient voiced understanding is agreeable for follow-up.  The patient has been appropriately medically screened and/or stabilized in the ED. I have low suspicion for any other emergent medical condition which would require further screening, evaluation or treatment in the ED or require inpatient management.  Patient is hemodynamically stable and in no acute distress.  Patient able to ambulate in department prior to ED.  Evaluation does not show acute pathology that would require ongoing or additional emergent interventions while in the emergency department or further inpatient treatment.  I have discussed the diagnosis with the patient and answered all questions.  Pain is been managed while in the emergency department and patient has no further complaints prior to discharge.  Patient is comfortable with plan discussed in room and is stable for discharge at this time.  I have discussed strict return precautions for returning to the emergency department.  Patient was  encouraged to follow-up with PCP/specialist refer to at discharge.    MDM Rules/Calculators/A&P                           Final Clinical Impression(s) / ED Diagnoses Final diagnoses:  Injury of head, initial encounter  Abrasion of scalp, initial encounter    Rx / DC Orders ED Discharge Orders    None       Maleigha Colvard A, PA-C 08/13/19 0102    Nira Conn, MD 08/13/19 2117

## 2021-12-18 ENCOUNTER — Emergency Department (HOSPITAL_COMMUNITY)
Admission: EM | Admit: 2021-12-18 | Discharge: 2021-12-19 | Discharge status: Against Medical Advice | Payer: BC Managed Care – PPO | Attending: Emergency Medicine | Admitting: Emergency Medicine

## 2021-12-18 ENCOUNTER — Emergency Department (HOSPITAL_COMMUNITY): Payer: BC Managed Care – PPO

## 2021-12-18 ENCOUNTER — Encounter (HOSPITAL_COMMUNITY): Payer: Self-pay | Admitting: Emergency Medicine

## 2021-12-18 ENCOUNTER — Other Ambulatory Visit: Payer: Self-pay

## 2021-12-18 DIAGNOSIS — Z5321 Procedure and treatment not carried out due to patient leaving prior to being seen by health care provider: Secondary | ICD-10-CM | POA: Diagnosis not present

## 2021-12-18 DIAGNOSIS — X500XXA Overexertion from strenuous movement or load, initial encounter: Secondary | ICD-10-CM | POA: Insufficient documentation

## 2021-12-18 DIAGNOSIS — M545 Low back pain, unspecified: Secondary | ICD-10-CM | POA: Diagnosis present

## 2021-12-18 MED ORDER — IBUPROFEN 200 MG PO TABS
600.0000 mg | ORAL_TABLET | Freq: Once | ORAL | Status: AC
  Administered 2021-12-18: 600 mg via ORAL
  Filled 2021-12-18: qty 3

## 2021-12-18 NOTE — ED Provider Triage Note (Signed)
Emergency Medicine Provider Triage Evaluation Note  Kevin Horne , a 36 y.o. male  was evaluated in triage.  Pt complains of back pain.  Started today.  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  He picked 1 up and his back immediately started to hurt.  Pain is located in the middle of his lower back and extends to the left and right.  Denies saddle anesthesia denies urinary and bowel incontinence.  Denies IV drug use and recent back surgery.  States that back pain is worse with flexion extension of his back.  Denies trauma.  Took ibuprofen at 3 PM this afternoon.  Review of Systems  Positive: See above Negative: See above  Physical Exam  BP 131/72 (BP Location: Left Arm)   Pulse 68   Temp 97.6 F (36.4 C) (Oral)   Resp 18   Ht 5\' 7"  (1.702 m)   Wt 100.2 kg   SpO2 99%   BMI 34.61 kg/m  Gen:   Awake, no distress   Resp:  Normal effort  MSK:   Moves extremities without difficulty  Other:    Medical Decision Making  Medically screening exam initiated at 9:58 PM.  Appropriate orders placed.  Jock Mahon was informed that the remainder of the evaluation will be completed by another provider, this initial triage assessment does not replace that evaluation, and the importance of remaining in the ED until their evaluation is complete.  Work up initiated   Harriet Pho, PA-C 12/18/21 2200

## 2021-12-18 NOTE — ED Triage Notes (Signed)
Pt in with sharp low back pain, started today while at work - states he does a lot of heavy lifting and bending at his job, feels like he pulled something. Denies any numbness of bilateral LE's, lower spine tender to touch

## 2021-12-19 ENCOUNTER — Encounter (HOSPITAL_COMMUNITY): Payer: Self-pay

## 2021-12-19 ENCOUNTER — Other Ambulatory Visit: Payer: Self-pay

## 2021-12-19 ENCOUNTER — Emergency Department (HOSPITAL_COMMUNITY)
Admission: EM | Admit: 2021-12-19 | Discharge: 2021-12-19 | Disposition: A | Discharge status: Home / Self Care | Payer: BC Managed Care – PPO | Source: Home / Self Care | Attending: Emergency Medicine | Admitting: Emergency Medicine

## 2021-12-19 DIAGNOSIS — X500XXA Overexertion from strenuous movement or load, initial encounter: Secondary | ICD-10-CM | POA: Insufficient documentation

## 2021-12-19 DIAGNOSIS — M545 Low back pain, unspecified: Secondary | ICD-10-CM | POA: Insufficient documentation

## 2021-12-19 MED ORDER — METHOCARBAMOL 500 MG PO TABS
1000.0000 mg | ORAL_TABLET | Freq: Once | ORAL | Status: AC
  Administered 2021-12-19: 1000 mg via ORAL
  Filled 2021-12-19: qty 2

## 2021-12-19 MED ORDER — NAPROXEN 500 MG PO TABS: 500.0000 mg | ORAL_TABLET | Freq: Two times a day (BID) | ORAL | 0 refills | Status: AC

## 2021-12-19 MED ORDER — LIDOCAINE 5 % EX PTCH
1.0000 | MEDICATED_PATCH | CUTANEOUS | Status: DC
  Administered 2021-12-19: 1 via TRANSDERMAL
  Filled 2021-12-19 (×2): qty 1

## 2021-12-19 MED ORDER — ACETAMINOPHEN 325 MG PO TABS
650.0000 mg | ORAL_TABLET | Freq: Once | ORAL | Status: AC
  Administered 2021-12-19: 650 mg via ORAL
  Filled 2021-12-19: qty 2

## 2021-12-19 MED ORDER — DICLOFENAC SODIUM 1 % EX GEL: 4.0000 g | Freq: Four times a day (QID) | CUTANEOUS | 0 refills | Status: DC

## 2021-12-19 MED ORDER — METHOCARBAMOL 750 MG PO TABS: 750.0000 mg | ORAL_TABLET | Freq: Four times a day (QID) | ORAL | 0 refills | Status: AC

## 2021-12-19 MED ORDER — KETOROLAC TROMETHAMINE 60 MG/2ML IM SOLN
30.0000 mg | Freq: Once | INTRAMUSCULAR | Status: AC
  Administered 2021-12-19: 30 mg via INTRAMUSCULAR
  Filled 2021-12-19: qty 2

## 2021-12-19 NOTE — ED Triage Notes (Signed)
Patient c/o mid low back pain that started yesterday while at work. Patient denies pain radiating down his legs. 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

## 2021-12-19 NOTE — Discharge Instructions (Addendum)
Mr. Kevin Horne, Kevin Horne were seen today for acute back pain. We believe this is due to the nature of your work causing you a muscle sprain/spasm. We believe you would benefit from some over the counter medications such as Naproxen and a short course of a muscle relaxant called Robaxin. You can also use topical medications such as voltaren gel once or twice a day to help you.  Attached is also a work excuse so you are able to rest and treat conservatively for the next two days.  Take care, Romana Juniper, MD

## 2021-12-19 NOTE — ED Notes (Signed)
Pt called for vitals update, no answer. 

## 2021-12-19 NOTE — ED Notes (Signed)
Pt provided w/ bus pass 

## 2021-12-19 NOTE — ED Notes (Signed)
Pt come in the triage area stating he never left and was probably asleep when they called him. He is checking in to finish evaluation.

## 2021-12-19 NOTE — ED Provider Notes (Signed)
[facility]-EMERGENCY DEPT Provider Note   CSN: [id number] Arrival date & time: 12/19/21  6659     History  Chief Complaint  Patient presents with   Back Pain    Kevin Horne is a 36 y.o. 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 He was at work tomorrow when he twisted and the pain came all of a sudden. It is ~6/10 at rest and 9/10 with movement. Pain does not radiate. He denies difficulty with ambulation, urination or BM. He denies nausea, vomiting, abdominal pain. Prior to presenting to ED he did not try any medications.   Since presenting to the ED, patient has tried ibuprofen with some relief of his pain.    The history is provided by the patient. No language interpreter was used.  Back Pain Location:  Lumbar spine Quality:  Aching Radiates to:  Does not radiate Pain is:  Same all the time Onset quality:  Sudden Duration:  24 hours Timing:  Constant Progression:  Unchanged Chronicity:  New Context: lifting heavy objects, occupational injury and physical stress   Relieved by:  NSAIDs Worsened by:  Movement Ineffective treatments:  None tried Associated symptoms: no abdominal pain, no bladder incontinence, no bowel incontinence, no chest pain, no dysuria, no fever, no leg pain, no numbness, no paresthesias, no pelvic pain, no perianal numbness and no weakness   Risk factors: obesity        Home Medications Prior to Admission medications   Medication Sig Start Date End Date Taking? Authorizing Provider  diclofenac Sodium (VOLTAREN) 1 % GEL Apply 4 g topically 4 (four) times daily. 12/19/21  Yes Morene Crocker, MD  methocarbamol (ROBAXIN-750) 750 MG tablet Take 1 tablet (750 mg total) by mouth 4 (four) times daily for 5 days. 12/19/21 12/24/21 Yes Morene Crocker, MD  naproxen (NAPROSYN) 500 MG tablet Take 1 tablet (500 mg total) by  mouth 2 (two) times daily with a meal for 7 days. 12/19/21 12/26/21 Yes Morene Crocker, MD  diclofenac (VOLTAREN) 75 MG EC tablet Take 1 tablet (75 mg total) by mouth 2 (two) times daily. 05/26/18   Mardella Layman, MD  predniSONE (STERAPRED UNI-PAK 21 TAB) 10 MG (21) TBPK tablet Take by mouth daily. Take as directed. 05/26/18   Mardella Layman, MD     None currently  Allergies    Patient has no known allergies.    Review of Systems   Review of Systems  Constitutional:  Negative for fever.  Cardiovascular:  Negative for chest pain.  Gastrointestinal: Negative.  Negative for abdominal pain and bowel incontinence.  Genitourinary:  Negative for bladder incontinence, difficulty urinating, dysuria, flank pain, pelvic pain and urgency.  Musculoskeletal:  Positive for back pain.  Neurological:  Negative for weakness, numbness and paresthesias.    Physical Exam Updated Vital Signs BP 115/76 (BP Location: Left Arm)   Pulse (!) 58   Temp (!) 97.5 F (36.4 C) (Oral)   Resp 16   Ht 5\' 7"  (1.702 m)   Wt 100.2 kg   SpO2 100%   BMI 34.61 kg/m  Physical Exam Constitutional:      Appearance: Normal appearance. He is obese.  Cardiovascular:     Rate and Rhythm: Normal rate and regular rhythm.  Pulmonary:     Effort: Pulmonary effort is normal.     Breath sounds: Normal breath sounds.  Abdominal:  General: Bowel sounds are normal.     Palpations: Abdomen is soft.     Tenderness: There is no right CVA tenderness or left CVA tenderness.  Musculoskeletal:        General: Tenderness present. No swelling.     Right lower leg: No edema.     Left lower leg: No edema.     Comments: Tenderness to palpation over bilateral paraspinal muscles without acute bony tenderness. Increased discomfort with movement. No radiation of pain to lower extremity with elevation of legs or crossing over legs. Patient able to ambulate.   Skin:    General: Skin is warm and dry.  Neurological:     General: No  focal deficit present.     Mental Status: He is alert and oriented to person, place, and time.  Psychiatric:        Mood and Affect: Mood normal.        Behavior: Behavior normal.     ED Results / Procedures / Treatments   Labs (all labs ordered are listed, but only abnormal results are displayed) Labs Reviewed - No data to display  EKG None  Radiology DG Thoracic Spine 2 View  Result Date: 12/18/2021 CLINICAL DATA:  Back pain, heavy lifting and bending at his job EXAM: THORACIC SPINE 2 VIEWS COMPARISON:  None Available. FINDINGS: There is no evidence of thoracic spine fracture. Alignment is normal. 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 Electronically Signed   By: Larose Hires D.O.   On: 12/18/2021 22:22   DG Lumbar Spine 2-3 Views  Result Date: 12/18/2021 CLINICAL DATA:  Low back pain.  Heavy lifting and bending at work. EXAM: LUMBAR SPINE - 2-3 VIEW COMPARISON:  None Available. FINDINGS: There is no evidence of lumbar spine fracture. Alignment is normal. Mild degenerate disc disease at L5-S1 with disc height loss and associated facet joint arthropathy. IMPRESSION: Mild degenerative disc disease at L5-S1. No acute fracture or traumatic subluxation. Electronically Signed   By: Larose Hires D.O.   On: 12/18/2021 22:20    Procedures Procedures    Medications Ordered in ED Medications  lidocaine (LIDODERM) 5 % 1 patch (1 patch Transdermal Patch Applied 12/19/21 1009)  methocarbamol (ROBAXIN) tablet 1,000 mg (1,000 mg Oral Given 12/19/21 1009)  acetaminophen (TYLENOL) tablet 650 mg (650 mg Oral Given 12/19/21 1009)  ketorolac (TORADOL) injection 30 mg (30 mg Intramuscular Given 12/19/21 1009)    ED Course/ Medical Decision Making/ A&P                           Medical Decision Making Risk OTC drugs. Prescription drug  management.   35yo patient with no known past medical history presenting with acute lower back pain. Ddx at this time include muscle strain vs sciatica vs fractures vs disc herniation given comorbidity and occupation.  After interview and physical exam, suspect this is a musculoskeletal injury given patient's occupation and onset of pain. Pex with negative straight and cross over leg exams, low concern for sciatica or foraminal narrowing at this time. Patient lumbar spine films show mild degenerative disc disease no other acute bony abnormalities, less concerned about fractures.  Patient received one dose of Ibuprofen with mild improvement in pain. Will attempt multimodal pain management with lidocaine patch, acetaminophen, robaxin and Toradol and monitor for improvement.  10:36A: patient  feeling better.   Given the potential for exacerbation of acute pain, favor rest and conservative management in the outpatient setting. Work note to be excused were provided. Precautions to return were reviewed with patient. Patient is in agreement with plan.    Final Clinical Impression(s) / ED Diagnoses Final diagnoses:  Acute bilateral low back pain without sciatica    Rx / DC Orders ED Discharge Orders          Ordered    naproxen (NAPROSYN) 500 MG tablet  2 times daily with meals        12/19/21 1030    methocarbamol (ROBAXIN-750) 750 MG tablet  4 times daily        12/19/21 1030    diclofenac Sodium (VOLTAREN) 1 % GEL  4 times daily        12/19/21 1030              Romana Juniper, MD 12/19/21 1037    Godfrey Pick, MD 12/19/21 Einar Crow

## 2023-11-12 ENCOUNTER — Ambulatory Visit
Admission: EM | Admit: 2023-11-12 | Discharge: 2023-11-12 | Disposition: A | Discharge status: Home / Self Care | Payer: No Typology Code available for payment source | Attending: Family Medicine | Admitting: Family Medicine

## 2023-11-12 ENCOUNTER — Ambulatory Visit: Payer: Self-pay | Admitting: Nurse Practitioner

## 2023-11-12 ENCOUNTER — Ambulatory Visit (INDEPENDENT_AMBULATORY_CARE_PROVIDER_SITE_OTHER): Payer: No Typology Code available for payment source

## 2023-11-12 DIAGNOSIS — M25562 Pain in left knee: Secondary | ICD-10-CM

## 2023-11-12 NOTE — Discharge Instructions (Signed)
 Your x-ray was negative for fracture.  Use the knee sleeve to help support the joint and also help with swelling.  You may elevate and ice as needed.  Take over-the-counter Tylenol  or ibuprofen  as needed.  Please follow-up with your PCP if your symptoms do not improve.  Please go to the ER for any worsening symptoms.  Hope you feel better soon!

## 2023-11-12 NOTE — ED Provider Notes (Signed)
 [facility]    CSN: [id number] Arrival date & time: 11/12/23  1831      History   Chief Complaint Chief Complaint  Patient presents with   Knee Pain    HPI Kevin Horne is a 38 y.o. male presents for knee pain.  Patient reports yesterday he had a sudden onset of anterior knee pain that felt like someone hit him with a chair.  Denies any actual injury or inciting event.  States since then it has been persistent and worse with weightbearing.  No swelling numbness or tingling.  No history of injuries or surgeries to the knee in the past.  He has been using Biofreeze and states it helps temporarily.  States he wants to make sure he did not tear anything.  No other concerns at this time.   Knee Pain   History reviewed. No pertinent past medical history.  There are no active problems to display for this patient.   History reviewed. No pertinent surgical history.     Home Medications    Prior to Admission medications   Medication Sig Start Date End Date Taking? Authorizing Provider  diclofenac  (VOLTAREN ) 75 MG EC tablet Take 1 tablet (75 mg total) by mouth 2 (two) times daily. 05/26/18   Rolinda Rogue, MD  diclofenac  Sodium (VOLTAREN ) 1 % GEL Apply 4 g topically 4 (four) times daily. 12/19/21   Elnora Ip, MD  predniSONE  (STERAPRED UNI-PAK 21 TAB) 10 MG (21) TBPK tablet Take by mouth daily. Take as directed. 05/26/18   Rolinda Rogue, MD    Family History Family History  Problem Relation Age of Onset   Diabetes Father    Hypertension Father     Social History Social History   Tobacco Use   Smoking status: Some Days    Types: Cigarettes   Smokeless tobacco: Never  Vaping Use   Vaping status: Never Used  Substance Use Topics   Alcohol use: Yes    Comment: socially   Drug use: No     Allergies   Patient has no known allergies.   Review of Systems Review of Systems  Musculoskeletal:        Left knee pain     Physical Exam Triage  Vital Signs ED Triage Vitals  Encounter Vitals Group     BP 11/12/23 1911 106/70     Girls Systolic BP Percentile --      Girls Diastolic BP Percentile --      Boys Systolic BP Percentile --      Boys Diastolic BP Percentile --      Pulse Rate 11/12/23 1911 (!) 58     Resp 11/12/23 1911 16     Temp 11/12/23 1911 97.6 F (36.4 C)     Temp Source 11/12/23 1911 Oral     SpO2 11/12/23 1911 98 %     Weight --      Height --      Head Circumference --      Peak Flow --      Pain Score 11/12/23 1909 4     Pain Loc --      Pain Education --      Exclude from Growth Chart --    No data found.  Updated Vital Signs BP 106/70 (BP Location: Right Arm)   Pulse (!) 58   Temp 97.6 F (36.4 C) (Oral)   Resp 16   SpO2 98%   Visual Acuity Right Eye Distance:   Left  Eye Distance:   Bilateral Distance:    Right Eye Near:   Left Eye Near:    Bilateral Near:     Physical Exam Vitals and nursing note reviewed.  Constitutional:      General: He is not in acute distress.    Appearance: Normal appearance. He is not ill-appearing.  HENT:     Head: Normocephalic and atraumatic.  Eyes:     Pupils: Pupils are equal, round, and reactive to light.  Cardiovascular:     Rate and Rhythm: Normal rate.  Pulmonary:     Effort: Pulmonary effort is normal.  Musculoskeletal:     Left knee: No swelling, deformity, effusion, erythema, ecchymosis, lacerations, bony tenderness or crepitus. Decreased range of motion. Tenderness present over the medial joint line and lateral joint line. Normal patellar mobility. Normal pulse.     Comments: Positive valgus and varus stress test  Skin:    General: Skin is warm and dry.  Neurological:     General: No focal deficit present.     Mental Status: He is alert and oriented to person, place, and time.  Psychiatric:        Mood and Affect: Mood normal.        Behavior: Behavior normal.      UC Treatments / Results  Labs (all labs ordered are listed, but  only abnormal results are displayed) Labs Reviewed - No data to display  EKG   Radiology DG Knee Complete 4 Views Left Result Date: 11/12/2023 CLINICAL DATA:  Left knee pain started yesterday no known injury but did hear a pop EXAM: LEFT KNEE - COMPLETE 4+ VIEW COMPARISON:  None Available. FINDINGS: No evidence of fracture, dislocation, or joint effusion. Degenerative subchondral cystic changes along the lateral tibial plateau. No aggressive focal bone abnormality. Soft tissues are unremarkable. IMPRESSION: No acute displaced fracture or dislocation. Electronically Signed   By: Morgane  Naveau M.D.   On: 11/12/2023 19:46    Procedures Procedures (including critical care time)  Medications Ordered in UC Medications - No data to display  Initial Impression / Assessment and Plan / UC Course  I have reviewed the triage vital signs and the nursing notes.  Pertinent labs & imaging results that were available during my care of the patient were reviewed by me and considered in my medical decision making (see chart for details).     Reviewed exam and symptoms with patient.  Discussed x-ray does not show ligaments or tendons and he would have to see a PCP for imaging regarding this.  Patient would like x-ray which was done showing no acute findings.  Will treat for strain/sprain with RICE therapy.  Neoprene knee sleeve applied and patient to take over-the-counter ibuprofen  or Tylenol  as needed.  He is to follow-up with his PCP if symptoms do not improve.  ER precautions reviewed Final Clinical Impressions(s) / UC Diagnoses   Final diagnoses:  Acute pain of left knee     Discharge Instructions      Your x-ray was negative for fracture.  Use the knee sleeve to help support the joint and also help with swelling.  You may elevate and ice as needed.  Take over-the-counter Tylenol  or ibuprofen  as needed.  Please follow-up with your PCP if your symptoms do not improve.  Please go to the ER for any  worsening symptoms.  Hope you feel better soon!     ED Prescriptions   None    PDMP not reviewed this encounter.  Loreda Myla SAUNDERS, NP 11/12/23 [phone]
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
## 2023-12-12 ENCOUNTER — Ambulatory Visit: Admitting: Family Medicine

## 2023-12-12 ENCOUNTER — Ambulatory Visit (INDEPENDENT_AMBULATORY_CARE_PROVIDER_SITE_OTHER): Admitting: Family Medicine

## 2023-12-12 ENCOUNTER — Encounter: Payer: Self-pay | Admitting: Family Medicine

## 2023-12-12 VITALS — BP 98/65 | HR 60 | Temp 97.0°F | Resp 18 | Ht 67.0 in | Wt 224.2 lb

## 2023-12-12 DIAGNOSIS — M25562 Pain in left knee: Secondary | ICD-10-CM

## 2023-12-12 DIAGNOSIS — G8929 Other chronic pain: Secondary | ICD-10-CM

## 2023-12-12 MED ORDER — DICLOFENAC SODIUM 1 % EX GEL: 4.0000 g | Freq: Four times a day (QID) | CUTANEOUS | 0 refills | Status: AC

## 2023-12-12 MED ORDER — MELOXICAM 15 MG PO TABS: 15.0000 mg | ORAL_TABLET | Freq: Every day | ORAL | 2 refills | Status: AC

## 2023-12-12 NOTE — Progress Notes (Signed)
 Assessment & Plan   Assessment/Plan:     Assessment & Plan Left knee pain Chronic left knee pain for approximately one month, likely due to a meniscal or ligamentous injury (ACL or PCL) given the buckling and pain. X-rays show no bony abnormalities. Pain is exacerbated by walking and certain movements, with a sensation of instability. Previous ibuprofen  use provided limited relief. Differential includes meniscal tear or ligamentous injury, not visible on x-ray. MRI is necessary for further evaluation. - Refer to orthopedics for further evaluation and management, including potential MRI and dynamic testing. - Prescribe meloxicam for pain management, advising to take with food to minimize gastrointestinal side effects. - Recommend use of Voltaren  gel (diclofenac ) topically for additional pain relief. - Advise wearing a knee brace for support and stability. - Provide basic home rehabilitation exercises to maintain mobility and prevent stiffness. - Instruct to test knee tolerance with a 1.5 to 2-mile run before engaging in full-contact sports. - Discuss potential need for imaging and further intervention based on orthopedic evaluation.      Medications Discontinued During This Encounter  Medication Reason   diclofenac  (VOLTAREN ) 75 MG EC tablet    diclofenac  Sodium (VOLTAREN ) 1 % GEL Reorder   predniSONE  (STERAPRED UNI-PAK 21 TAB) 10 MG (21) TBPK tablet     Return if symptoms worsen or fail to improve.        Subjective:   Encounter date: 12/12/2023  Kevin Horne is a 38 y.o. male who does not have a problem list on file.SABRA   He  has no past medical history on file.SABRA   He presents with chief complaint of Establish Care (Pt is not fasting today//HM due- vaccinations ( Pt declined) ) and Knee Pain (Pt c/o of left knee pain; Pt went to [facility] on 11/12/2023 and X- RAY was performed. Pt was using OTC Biofreeze and knee brace for symptoms ) .   Discussed the use of AI scribe software  for clinical note transcription with the patient, who gave verbal consent to proceed.  History of Present Illness Kevin Horne is a 38 year old male who presents with ongoing left knee pain.  Left knee pain - Persistent left knee pain for approximately one month, onset after stepping awkwardly at work - Pain described as feeling like 'somebody just hit my knee' - Pain occasionally radiates down to the ankle - Pain is unpredictable, occurring during activities such as walking or after getting out of the shower - Occasional sensations of instability in the knee  Prior interventions and response - Ibuprofen  used intermittently with limited relief - Knee brace provided and used occasionally, but not consistently helpful - No regular use of other supportive devices or topical treatments  Imaging and diagnostic evaluation - X-ray performed with no abnormalities identified - No MRI or other advanced imaging studies completed to assess for soft tissue injury  Occupational and physical activity history - History of playing high-contact sports, including full gear football - 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 No pertinent surgical history.  Outpatient Medications Prior to Visit  Medication Sig Dispense Refill   diclofenac  (VOLTAREN ) 75 MG EC tablet Take 1 tablet (75 mg total) by mouth 2 (two) times daily. (Patient not taking: Reported on 12/12/2023) 14 tablet 0   diclofenac  Sodium (VOLTAREN ) 1 % GEL Apply 4 g  topically 4 (four) times daily. (Patient not taking: Reported on 12/12/2023) 4 g 0   predniSONE  (STERAPRED UNI-PAK 21 TAB) 10 MG (21) TBPK tablet Take by mouth daily. Take as directed. (Patient not taking: Reported on 12/12/2023) 21 tablet 0   No facility-administered medications  prior to visit.    Family History  Problem Relation Age of Onset   Diabetes Father    Hypertension Father     Social History   Socioeconomic History   Marital status: Single    Spouse name: Not on file   Number of children: Not on file   Years of education: Not on file   Highest education level: Not on file  Occupational History   Not on file  Tobacco Use   Smoking status: Some Days    Current packs/day: 0.25    Average packs/day: 0.3 packs/day for 19.8 years (4.9 ttl pk-yrs)    Types: Cigarettes    Start date: 2006   Smokeless tobacco: Never   Tobacco comments:    Pt smokes 1-2 cigarettes per day  Vaping Use   Vaping status: Never Used  Substance and Sexual Activity   Alcohol use: Yes    Comment: socially   Drug use: No   Sexual activity: Yes    Birth control/protection: None  Other Topics Concern   Not on file  Social History Narrative   Not on file   Social Drivers of Health   Financial Resource Strain: Not on file  Food Insecurity: Not on file  Transportation Needs: Not on file  Physical Activity: Not on file  Stress: Not on file  Social Connections: Not on file  Intimate Partner Violence: Not on file                                                                                                  Objective:  Physical Exam: BP 98/65 (BP Location: Left Arm, Patient Position: Sitting, Cuff Size: Large)   Pulse 60   Temp (!) 97 F (36.1 C) (Temporal)   Resp 18   Ht 5' 7 (1.702 m)   Wt 224 lb 3.2 oz (101.7 kg)   SpO2 100%   BMI 35.11 kg/m    Physical Exam GENERAL: Alert, cooperative, well developed, no acute distress. HEENT: Normocephalic, normal oropharynx, moist mucous membranes. CHEST: Clear to auscultation bilaterally, no wheezes, rhonchi, or crackles. CARDIOVASCULAR: Normal heart rate and rhythm, S1 and S2 normal without murmurs. ABDOMEN: Soft, non-tender, non-distended, without organomegaly, normal bowel sounds. MUSCULOSKELETAL:  Tenderness on palpation of left anterior tibia, Valgus and Varus Stress tenderness NEUROLOGICAL: Cranial nerves grossly intact, moves all extremities without gross motor or sensory deficit.   Physical Exam  DG Knee Complete 4 Views Left Result Date: 11/12/2023 CLINICAL DATA:  Left knee pain started yesterday no known injury but did hear a pop EXAM: LEFT KNEE - COMPLETE 4+ VIEW COMPARISON:  None Available. FINDINGS: No evidence of fracture, dislocation, or joint effusion. Degenerative subchondral cystic changes along the lateral tibial plateau. No aggressive focal bone abnormality. Soft tissues are unremarkable. IMPRESSION: No acute displaced fracture or dislocation.  Electronically Signed   By: Morgane  Naveau M.D.   On: 11/12/2023 19:46    No results found for this or any previous visit (from the past 2160 hours).      Beverley Adine Hummer, MD, MS

## 2023-12-12 NOTE — Patient Instructions (Addendum)
  VISIT SUMMARY: You visited us  today due to ongoing left knee pain that has been persistent for about a month. The pain started after an awkward step at work and has been unpredictable, sometimes radiating down to your ankle and causing sensations of instability.  YOUR PLAN: LEFT KNEE PAIN: Chronic left knee pain likely due to a meniscal or ligamentous injury, as suggested by the buckling and pain. X-rays showed no bone issues, but an MRI is needed for further evaluation. -You will be referred to orthopedics for further evaluation and management, including a potential MRI and dynamic testing. -You are prescribed meloxicam for pain management. Please take it with food to minimize stomach upset. -Use Voltaren  gel (diclofenac ) topically for additional pain relief. -Wear a knee brace for support and stability. -Perform basic home rehabilitation exercises to maintain mobility and prevent stiffness. -Test your knee tolerance with a 1.5 to 2-mile run before engaging in full-contact sports. -Further imaging and intervention may be needed based on the orthopedic evaluation.

## 2023-12-30 ENCOUNTER — Encounter: Payer: Self-pay | Admitting: Radiology

## 2024-01-03 ENCOUNTER — Ambulatory Visit: Admitting: Surgical

## 2024-01-03 DIAGNOSIS — M25562 Pain in left knee: Secondary | ICD-10-CM | POA: Diagnosis not present

## 2024-01-05 ENCOUNTER — Encounter: Payer: Self-pay | Admitting: Surgical

## 2024-01-05 NOTE — Progress Notes (Signed)
 Office Visit Note   Patient: Kevin Horne           Date of Birth: [birth year]           MRN: [id number] Visit Date: 01/03/2024 Requested by: Sebastian Beverley NOVAK, MD [address] [place],  [place] [address] PCP: Sebastian Beverley NOVAK, MD  Subjective: Chief Complaint  Patient presents with   Left Knee - Pain    HPI: Kevin Horne is a 38 y.o. male who presents to the office reporting patient is a 38 year old male who presents for evaluation of left knee pain.  Patient states that about 1 month ago he was walking on flat grounds and all of a sudden felt like something punched [him] in the knee.  He has never really had any issues with his knee before.  Since then he describes a nebulous pain that will sometimes cause pain in the medial aspect of the knee or the lateral aspect of the knee or directly anterior or another various spots around the knee.  He has daily pain that will come and go in intensity.  No mechanical symptoms that are new.  Takes meloxicam with some relief.  No history of prior surgery to the knee.  He denies any significant medical history such as diabetes or heart disease.  Does smoke cigarettes.  He works in a magazine features editor.  In his free time he enjoys playing video games as well as playing tackle football with his buddies.              ROS: All systems reviewed are negative as they relate to the chief complaint within the history of present illness.  Patient denies fevers or chills.  Assessment & Plan: Visit Diagnoses:  1. Acute pain of left knee     Plan: Impression is 38 year old male who presents for evaluation of left knee pain.  Has atraumatic onset of knee pain with radiographs taken at urgent care demonstrating no significant abnormality.  Does have small effusion on exam today but otherwise exam is unremarkable.  We discussed options available to patient.  Anti-inflammatories have not provided any relief and his symptoms are no better over the  last month.  After discussion, plan for further evaluation with MRI of the left knee and we will see him back after MRI to review results.  Follow-Up Instructions: No follow-ups on file.   Orders:  Orders Placed This Encounter  Procedures   MR Knee Left w/o contrast   No orders of the defined types were placed in this encounter.     Procedures: No procedures performed   Clinical Data: No additional findings.  Objective: Vital Signs: There were no vitals taken for this visit.  Physical Exam:  Constitutional: Patient appears well-developed HEENT:  Head: Normocephalic Eyes:EOM are normal Neck: Normal range of motion Cardiovascular: Normal rate Pulmonary/chest: Effort normal Neurologic: Patient is alert Skin: Skin is warm Psychiatric: Patient has normal mood and affect  Ortho Exam: Ortho exam demonstrates left knee with small effusion.  Tenderness over the medial joint line moderately and lateral joint line mildly.  Palpable DP pulse of the left lower extremity.  Able to perform straight leg raise without extensor lag.  He has excellent quad and hamstring strength rated 5/5.  No pain with hip range of motion.  Stable to varus and valgus stress at 0 and 30 degrees.  Stable to anterior posterior drawer without laxity.  Stable to Lachman exam.  No cellulitis or skin changes noted.  No tenderness over the pes anserine bursa or prepatellar bursa.  No bursal distention noted.  Does have some mild tenderness over the patellar tendon as well.  No calf tenderness.  Negative Homans' sign.  Specialty Comments:  No specialty comments available.  Imaging: No results found.   PMFS History: There are no active problems to display for this patient.  No past medical history on file.  Family History  Problem Relation Age of Onset   Diabetes Father    Hypertension Father     No past surgical history on file. Social History   Occupational History   Not on file  Tobacco Use    Smoking status: Some Days    Current packs/day: 0.25    Average packs/day: 0.3 packs/day for 19.9 years (5.0 ttl pk-yrs)    Types: Cigarettes    Start date: 2006   Smokeless tobacco: Never   Tobacco comments:    Pt smokes 1-2 cigarettes per day  Vaping Use   Vaping status: Never Used  Substance and Sexual Activity   Alcohol use: Yes    Comment: socially   Drug use: No   Sexual activity: Yes    Birth control/protection: None
